# Patient Record
Sex: Female | Born: 1975 | Race: White | Hispanic: No | Marital: Married | Smoking: Never smoker
Health system: Southern US, Community
[De-identification: ages and names within clinical notes are randomized; demographics above are authoritative.]

## PROBLEM LIST (undated history)

## (undated) HISTORY — PX: TONSILLECTOMY: SUR1361

## (undated) HISTORY — PX: BREAST ENHANCEMENT SURGERY: SHX7

---

## 2015-12-08 ENCOUNTER — Inpatient Hospital Stay (HOSPITAL_COMMUNITY): Payer: No Typology Code available for payment source

## 2015-12-08 ENCOUNTER — Encounter (HOSPITAL_COMMUNITY): Payer: Self-pay | Admitting: *Deleted

## 2015-12-08 ENCOUNTER — Inpatient Hospital Stay (HOSPITAL_COMMUNITY)
Admission: AD | Admit: 2015-12-08 | Discharge: 2015-12-08 | Disposition: A | Discharge status: Home / Self Care | Payer: No Typology Code available for payment source | Source: Ambulatory Visit | Attending: Obstetrics and Gynecology | Admitting: Obstetrics and Gynecology

## 2015-12-08 DIAGNOSIS — O209 Hemorrhage in early pregnancy, unspecified: Secondary | ICD-10-CM

## 2015-12-08 DIAGNOSIS — Z3A15 15 weeks gestation of pregnancy: Secondary | ICD-10-CM | POA: Diagnosis not present

## 2015-12-08 DIAGNOSIS — O034 Incomplete spontaneous abortion without complication: Secondary | ICD-10-CM | POA: Diagnosis not present

## 2015-12-08 LAB — CBC
HEMATOCRIT: 36.2 % (ref 36.0–46.0)
Hemoglobin: 12.6 g/dL (ref 12.0–15.0)
MCH: 30.2 pg (ref 26.0–34.0)
MCHC: 34.8 g/dL (ref 30.0–36.0)
MCV: 86.8 fL (ref 78.0–100.0)
Platelets: 211 10*3/uL (ref 150–400)
RBC: 4.17 MIL/uL (ref 3.87–5.11)
RDW: 12.5 % (ref 11.5–15.5)
WBC: 10.1 10*3/uL (ref 4.0–10.5)

## 2015-12-08 LAB — HCG, QUANTITATIVE, PREGNANCY: hCG, Beta Chain, Quant, S: 11184 m[IU]/mL — ABNORMAL HIGH (ref <5)

## 2015-12-08 MED ORDER — HYDROMORPHONE HCL 1 MG/ML IJ SOLN
1.0000 mg | Freq: Once | INTRAMUSCULAR | Status: AC
  Administered 2015-12-08: 1 mg via INTRAVENOUS
  Filled 2015-12-08: qty 1

## 2015-12-08 MED ORDER — LACTATED RINGERS IV BOLUS (SEPSIS)
1000.0000 mL | Freq: Once | INTRAVENOUS | Status: AC
  Administered 2015-12-08: 1000 mL via INTRAVENOUS

## 2015-12-08 MED ORDER — KETOROLAC TROMETHAMINE 30 MG/ML IJ SOLN
30.0000 mg | Freq: Once | INTRAMUSCULAR | Status: AC
  Administered 2015-12-08: 30 mg via INTRAVENOUS
  Filled 2015-12-08: qty 1

## 2015-12-08 MED ORDER — MISOPROSTOL 200 MCG PO TABS
800.0000 ug | ORAL_TABLET | Freq: Once | ORAL | Status: AC
  Administered 2015-12-08: 800 ug via VAGINAL
  Filled 2015-12-08: qty 4

## 2015-12-08 NOTE — MAU Note (Signed)
Pt presents to MAU with complaints of heavy vaginal bleeding. States she started spotting over the weekend and had an appointment with [facility] for an U/S on Monday and when she had the U/S it showed a miscarriage. She states that the bleeding was heavy yesterday but slowed down but when she woke up she had soaked the bed and she has changed 5 or 6 pads this morning with heavy bleeding.

## 2015-12-08 NOTE — Discharge Instructions (Signed)
Incomplete Miscarriage A miscarriage is the sudden loss of an unborn baby (fetus) before the 20th week of pregnancy. In an incomplete miscarriage, parts of the fetus or placenta (afterbirth) remain in the body.  Having a miscarriage can be an emotional experience. Talk with your health care provider about any questions you may have about miscarrying, the grieving process, and your future pregnancy plans. CAUSES   Problems with the fetal chromosomes that make it impossible for the baby to develop normally. Problems with the baby's genes or chromosomes are most often the result of errors that occur by chance as the embryo divides and grows. The problems are not inherited from the parents.  Infection of the cervix or uterus.  Hormone problems.  Problems with the cervix, such as having an incompetent cervix. This is when the tissue in the cervix is not strong enough to hold the pregnancy.  Problems with the uterus, such as an abnormally shaped uterus, uterine fibroids, or congenital abnormalities.  Certain medical conditions.  Smoking, drinking alcohol, or taking illegal drugs.  Trauma. SYMPTOMS   Vaginal bleeding or spotting, with or without cramps or pain.  Pain or cramping in the abdomen or lower back.  Passing fluid, tissue, or blood clots from the vagina. DIAGNOSIS  Your health care provider will perform a physical exam. You may also have an ultrasound to confirm the miscarriage. Blood or urine tests may also be ordered. TREATMENT   Usually, a dilation and curettage (D&C) procedure is performed. During a D&C procedure, the cervix is widened (dilated) and any remaining fetal or placental tissue is gently removed from the uterus.  Antibiotic medicines are prescribed if there is an infection. Other medicines may be given to reduce the size of the uterus (contract) if there is a lot of bleeding.  If you have Rh negative blood and your baby was Rh positive, you will need a Rho (D)  immune globulin shot. This shot will protect any future baby from having Rh blood problems in future pregnancies.  You may be confined to bed rest. This means you should stay in bed and only get up to use the bathroom. HOME CARE INSTRUCTIONS   Rest as directed by your health care provider.  Restrict activity as directed by your health care provider. You may be allowed to continue light activity if curettage was not done but you require further treatment.  Keep track of the number of pads you use each day. Keep track of how soaked (saturated) they are. Record this information.  Do not  use tampons.  Do not douche or have sexual intercourse until approved by your health care provider.  Keep all follow-up appointments for reevaluation and continuing management.  Only take over-the-counter or prescription medicines for pain, fever, or discomfort as directed by your health care provider.  Take antibiotic medicine as directed by your health care provider. Make sure you finish it even if you start to feel better. SEEK IMMEDIATE MEDICAL CARE IF:   You experience severe cramps in your stomach, back, or abdomen.  You have an unexplained temperature (make sure to record these temperatures).  You pass large clots or tissue (save these for your health care provider to inspect).  Your bleeding increases.  You become light-headed, weak, or have fainting episodes. MAKE SURE YOU:   Understand these instructions.  Will watch your condition.  Will get help right away if you are not doing well or get worse.   This information is not intended to   replace advice given to you by your health care provider. Make sure you discuss any questions you have with your health care provider.   Document Released: 12/03/2005 Document Revised: 12/24/2014 Document Reviewed: 07/02/2013 Elsevier Interactive Patient Education 2016 [employer]  

## 2015-12-08 NOTE — MAU Provider Note (Signed)
History     CSN: [id number]  Arrival date and time: 12/08/15 1112   First Provider Initiated Contact with Patient 12/08/15 1133      Chief Complaint  Patient presents with  . Vaginal Bleeding   HPI   [name] is a 39 y.o. female G3P1011 at [id number] presenting to the MAU with vaginal bleeding. The bleeding started on Saturday while the patient was out of town. It started out as very light with some mucus. She spoke to Dr. Chestine Sporelark on "Sunday and was advised to be seen if bleeding increased. Sunday night her bleeding increased. She was seen in Dr. Ross's office on Monday was told the gestational sac and yolk sac only measured 6 weeks; per LMP patient should have been 14-15 weeks.   She was informed that this was a miscarriage in progress and told that likely due to the amount of bleeding that she likely passed everything. The patient was offered cytotec in which she declined at the time.   Patient says this is the worse pain she has ever been in. She currently rates her abdominal cramping 8/10  OB History    Gravida Para Term Preterm AB TAB SAB Ectopic Multiple Living   3 1 1  1  1   1      History reviewed. No pertinent past medical history.  Past Surgical History  Procedure Laterality Date  . Tonsillectomy    . Breast enhancement surgery      History reviewed. No pertinent family history.  Social History  Substance Use Topics  . Smoking status: Never Smoker   . Smokeless tobacco: Never Used  . Alcohol Use: No    Allergies: No Known Allergies  No prescriptions prior to admission   Results for orders placed or performed during the hospital encounter of 12/08/15 (from the past 48 hour(s))  CBC     Status: None   Collection Time: 12/08/15 11:45 AM  Result Value Ref Range   WBC 10.1 4.0 - 10.5 K/uL   RBC 4.17 3.87 - 5.11 MIL/uL   Hemoglobin 12.6 12.0 - 15.0 g/dL   HCT 36.2 36.0 - 46.0 %   MCV 86.8 78.0 - 100.0 fL   MCH 30.2 26.0 - 34.0 pg   MCHC 34.8 30.0 -  36.0 g/dL   RDW 12.5 11.5 - 15.5 %   Platelets 211 150 - 400 K/uL  hCG, quantitative, pregnancy     Status: Abnormal   Collection Time: 12/08/15 11:46 AM  Result Value Ref Range   hCG, Beta Chain, Quant, S 11184 (H) <5 mIU/mL    Comment:          GEST. AGE      CONC.  (mIU/mL)   <=1 WEEK        5 - 50     2 WEEKS       50 - 500     3 WEEKS       100 - 10,000     4 WEEKS     1,000 - 30,000     5 WEEKS     3,500 - 115,000   6-8 WEEKS     12,000 - 270,000    12 WEEKS     15" ,000 - 220,000        FEMALE AND NON-PREGNANT FEMALE:     LESS THAN 5 mIU/mL [facility]   ABO/Rh     Status: None   Collection Time: 12/08/15 11:47 AM  Result Value Ref Range   ABO/RH(D) A POS    US Ob Comp Less 14 Wks  12/08/2015  CLINICAL DATA:  Vaginal bleeding during first trimester pregnancy. Threatened abortion. EXAM: OBSTETRIC <14 WK [place] AND TRANSVAGINAL OB US TECHNIQUE: Both transabdominal and transvaginal ultrasound examinations were performed for complete evaluation of the gestation as well as the maternal uterus, adnexal regions, and pelvic cul-de-sac. Transvaginal technique was performed to assess early pregnancy. COMPARISON:  None. FINDINGS: Intrauterine gestational sac: A single ovoid sac-like structure is seen in the lower portion of the endometrial cavity. Yolk sac:  None visualized Embryo:  None visualized MSD: 7  mm   5 w   3  d Subchorionic hemorrhage:  None visualized. Maternal uterus/adnexae: Heterogeneous material in endocervical canal likely represents blood clot. Both ovaries are normal in appearance. No suspicious adnexal mass or abnormal free fluid identified. IMPRESSION: Probable 5 week intrauterine gestational sac seen in inferior portion of endometrial cavity, but no yolk sac, fetal pole, or cardiac activity yet visualized. Location in lower uterine segment and probable blood clot within endocervical canal are poor prognostic signs. Recommend followup of quantitative HCG  levels, and continued followup by ultrasound in 14 days if clinically warranted. This recommendation follows SRU consensus guidelines: Diagnostic Criteria for Nonviable Pregnancy Early in the First Trimester. Malva Limes Med 2013; 161:0960-45. Electronically Signed   By: Myles Rosenthal M.D.   On: 12/08/2015 13:21   US Ob Transvaginal  12/08/2015  CLINICAL DATA:  Vaginal bleeding during first trimester pregnancy. Threatened abortion. EXAM: OBSTETRIC <14 WK [place] AND TRANSVAGINAL OB US TECHNIQUE: Both transabdominal and transvaginal ultrasound examinations were performed for complete evaluation of the gestation as well as the maternal uterus, adnexal regions, and pelvic cul-de-sac. Transvaginal technique was performed to assess early pregnancy. COMPARISON:  None. FINDINGS: Intrauterine gestational sac: A single ovoid sac-like structure is seen in the lower portion of the endometrial cavity. Yolk sac:  None visualized Embryo:  None visualized MSD: 7  mm   5 w   3  d Subchorionic hemorrhage:  None visualized. Maternal uterus/adnexae: Heterogeneous material in endocervical canal likely represents blood clot. Both ovaries are normal in appearance. No suspicious adnexal mass or abnormal free fluid identified. IMPRESSION: Probable 5 week intrauterine gestational sac seen in inferior portion of endometrial cavity, but no yolk sac, fetal pole, or cardiac activity yet visualized. Location in lower uterine segment and probable blood clot within endocervical canal are poor prognostic signs. Recommend followup of quantitative HCG levels, and continued followup by ultrasound in 14 days if clinically warranted. This recommendation follows SRU consensus guidelines: Diagnostic Criteria for Nonviable Pregnancy Early in the First Trimester. Malva Limes Med 2013; 409:8119-14. Electronically Signed   By: Myles Rosenthal M.D.   On: 12/08/2015 13:21    Review of Systems  Constitutional: Negative for fever and chills.  Gastrointestinal: Positive  for nausea and abdominal pain. Negative for vomiting.  Neurological: Positive for dizziness.   Physical Exam   Blood pressure 103/61, pulse 64, temperature 97.9 F (36.6 C), resp. rate 18, last menstrual period 08/21/2015, unknown if currently breastfeeding.  Physical Exam  Constitutional: She is oriented to person, place, and time. She appears well-developed and well-nourished. No distress.  HENT:  Head: Normocephalic.  Eyes: Pupils are equal, round, and reactive to light.  Neck: Neck supple.  Respiratory: Effort normal.  GI: Soft. There is generalized tenderness.  Genitourinary:  Speculum exam: Vagina - Small amount of dark red blood noted in the vaginal  canal. Fox swabs not needed.  Cervix - cervix appears open 2-3 cm with clot noted protruding through cervical os. Unable to remove clot with ring forceps.  Bimanual exam: Cervix open Uterus non tender, enlarged  Adnexa non tender, no masses bilaterally Chaperone present for exam.  Neurological: She is alert and oriented to person, place, and time.  Skin: Skin is warm. She is not diaphoretic. There is pallor.  Psychiatric: Her behavior is normal.    MAU Course  Procedures  None  MDM  CBC Quant ABO A positive blood type   LR bolus Dilaudid  1 mg IV Toradol 30 mg IV  Dilaudid repeated 1 mg IV Toradol repeated 30 mg IV   [place].  Discussed patient with Dr. Claiborne Billings. Cytotec recommended Discussed US findings with the patient.  Discussed the use of cytotec due to incomplete SAB and gestational sac in the lower uterine segment of the uterus. Patient and significant other agreeable to plan of care. Consent form signed Cytotec 800 mcg administered per vagina by NP. Patient tolerated well.   Assessment and Plan   A:  1. Incomplete miscarriage   2. Vaginal bleeding in pregnancy, first trimester    P:  Discharge home in stable condition Bleeding precautions Patient has RX at home for percocet.  Ibuprofen as  directed on the bottle Return to MAU if symptoms worsen Call the office on Tuesday to schedule follow up visit in the office Support given.    Duane Lope, NP 12/08/2015 9:38 PM

## 2015-12-09 LAB — ABO/RH: ABO/RH(D): A POS

## 2017-01-10 ENCOUNTER — Other Ambulatory Visit: Payer: Self-pay | Admitting: Obstetrics and Gynecology

## 2017-01-10 DIAGNOSIS — N63 Unspecified lump in unspecified breast: Secondary | ICD-10-CM

## 2017-01-15 ENCOUNTER — Other Ambulatory Visit: Payer: Self-pay | Admitting: Obstetrics and Gynecology

## 2017-01-15 ENCOUNTER — Ambulatory Visit
Admission: RE | Admit: 2017-01-15 | Discharge: 2017-01-15 | Disposition: A | Discharge status: Home / Self Care | Payer: PRIVATE HEALTH INSURANCE | Source: Ambulatory Visit | Attending: Obstetrics and Gynecology | Admitting: Obstetrics and Gynecology

## 2017-01-15 DIAGNOSIS — N63 Unspecified lump in unspecified breast: Secondary | ICD-10-CM

## 2017-07-27 IMAGING — US US OB COMP LESS 14 WK
1 series · 15 of 28 positions shown · non-contrast
Comparison: None.

CLINICAL DATA: Vaginal bleeding during first trimester pregnancy.
Threatened abortion.

EXAM:
OBSTETRIC <14 WK US AND TRANSVAGINAL OB US
TECHNIQUE: Both transabdominal and transvaginal ultrasound examinations were
performed for complete evaluation of the gestation as well as the
maternal uterus, adnexal regions, and pelvic cul-de-sac.
Transvaginal technique was performed to assess early pregnancy.

[Series 1: us ob comp less 14 wk · 15 of 61 slices shown]
[im 1/61]
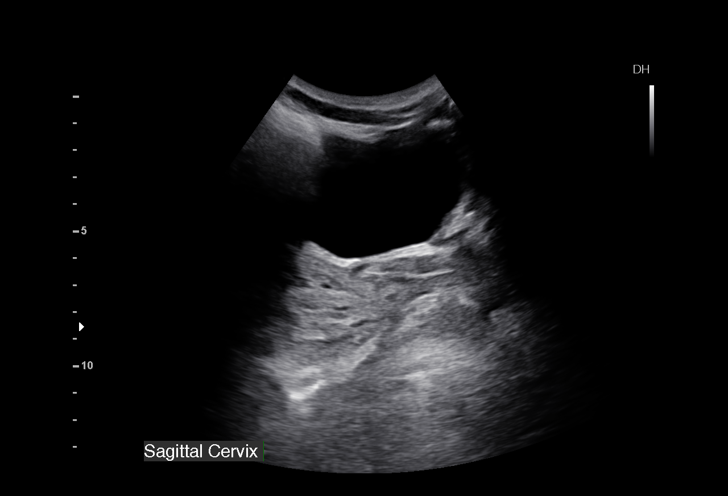
[im 5/61]
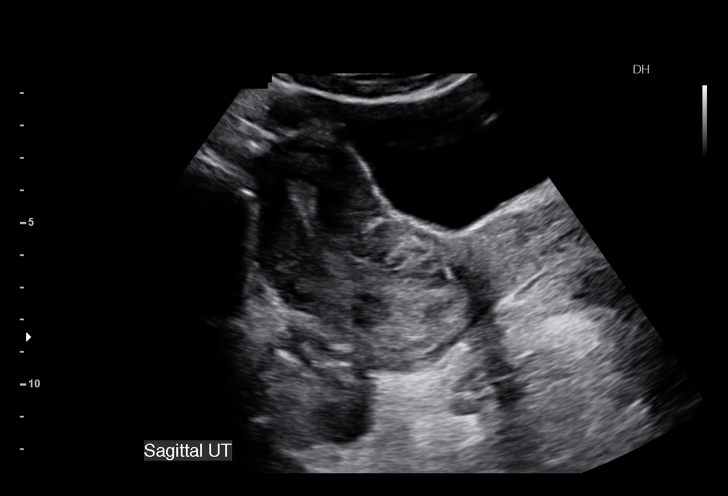
[im 9/61]
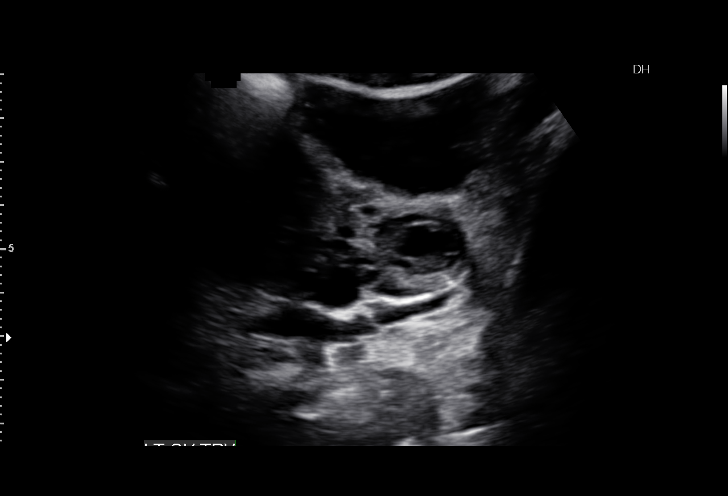
[im 14/61]
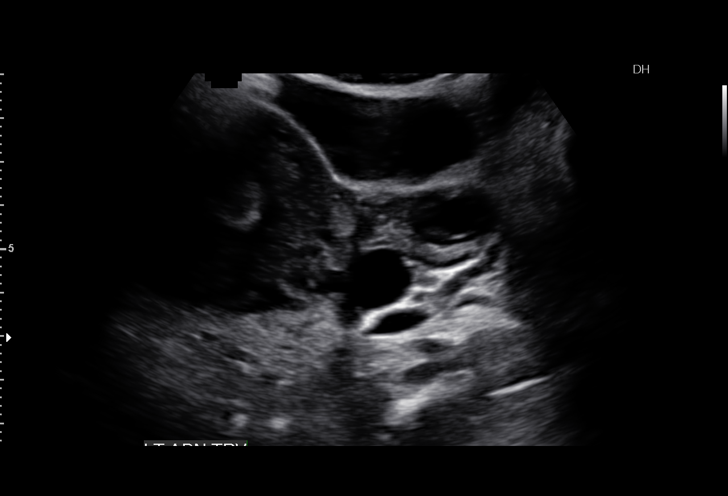
[im 18/61]
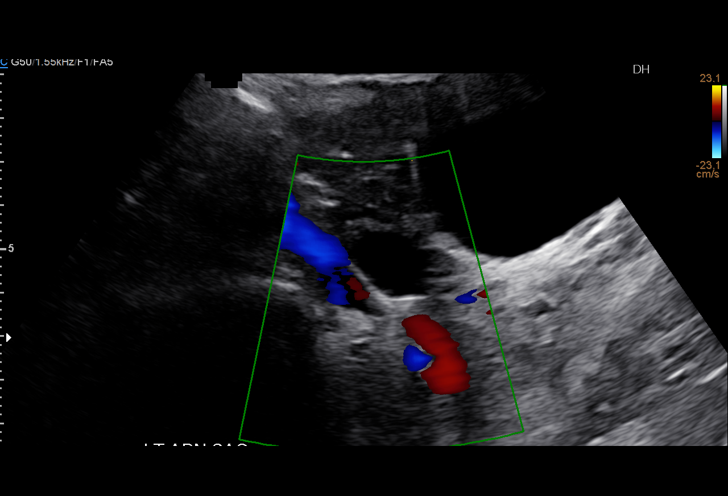
[im 23/61]
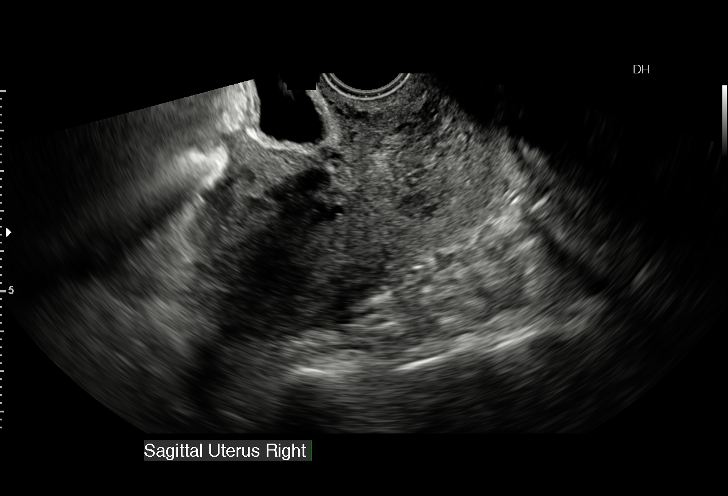
[im 27/61]
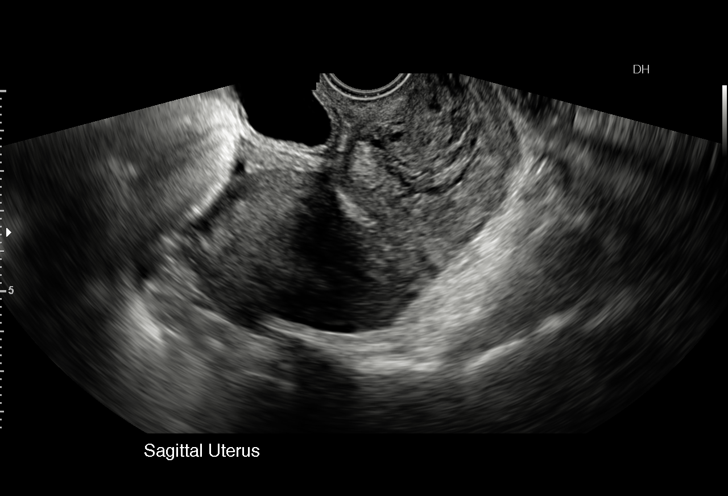
[im 32/61]
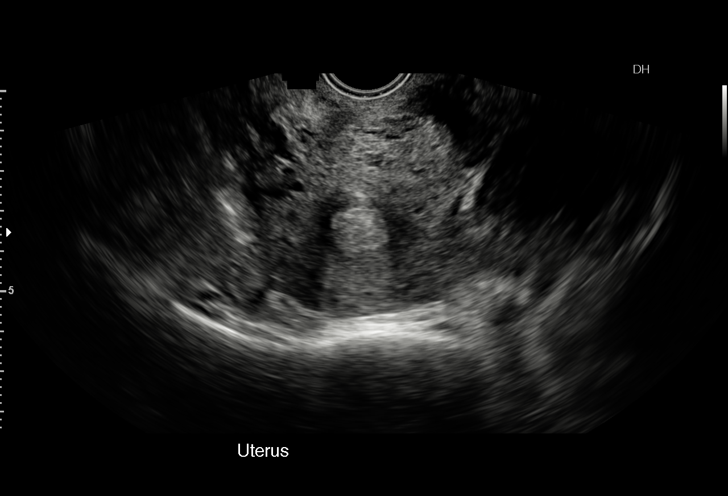
[im 34/61]
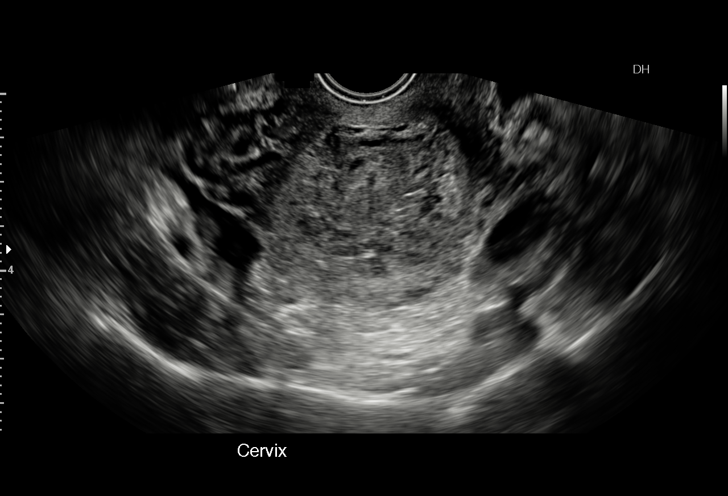
[im 38/61]
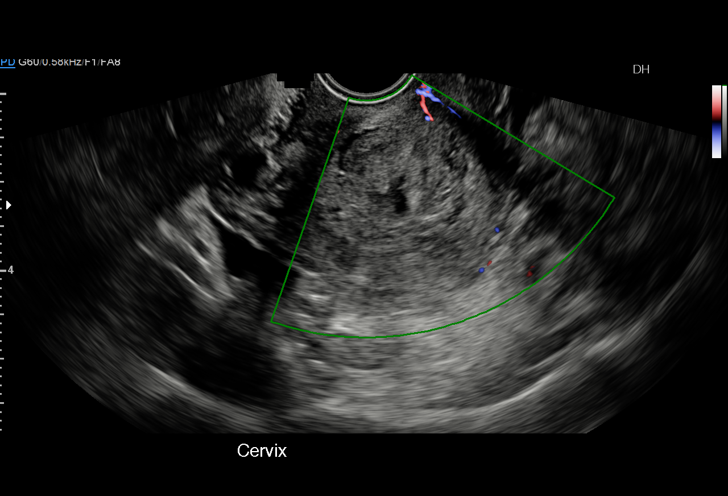
[im 43/61]
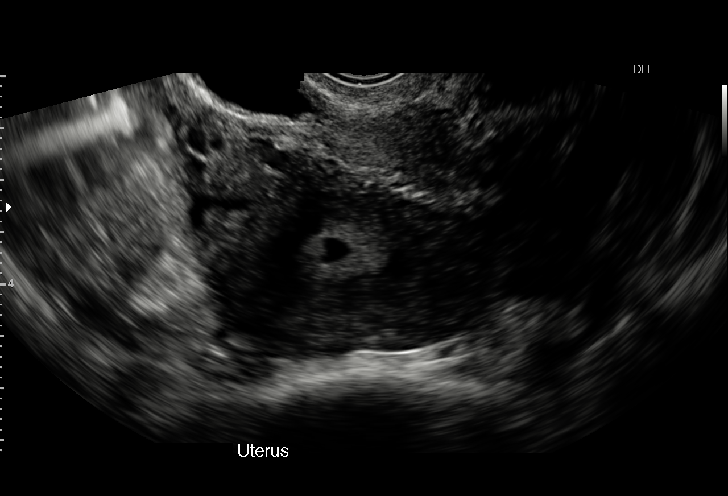
[im 47/61]
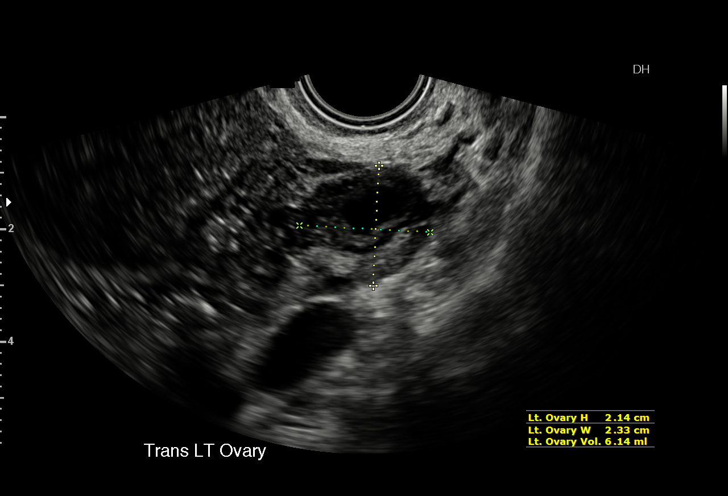
[im 52/61]
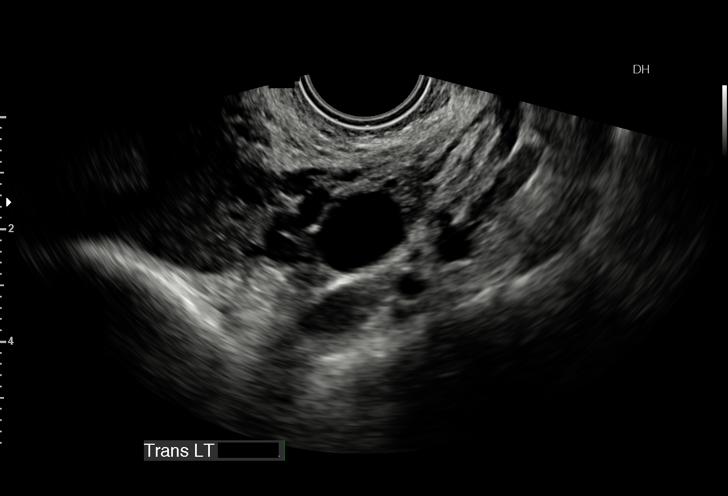
[im 56/61]
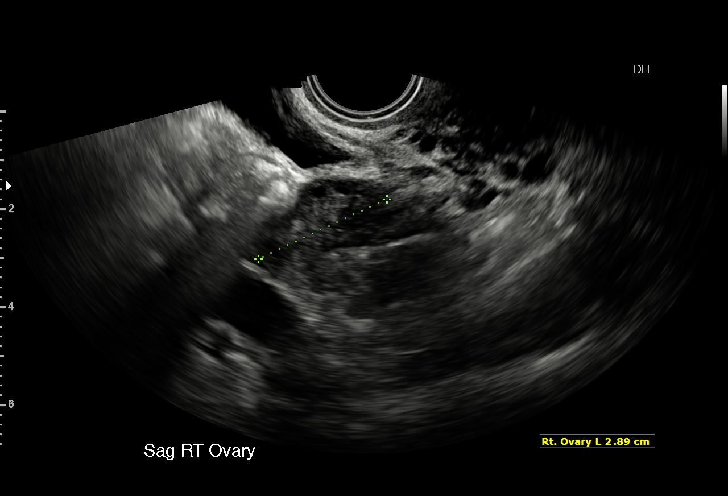
[im 61/61]
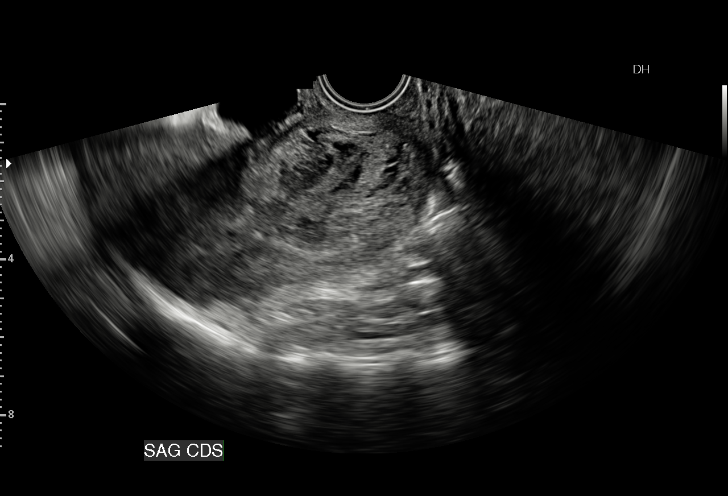

[15 of 28 positions shown; findings below may reference images not displayed]

FINDINGS: Intrauterine gestational sac: A single ovoid sac-like structure is
seen in the lower portion of the endometrial cavity.

Yolk sac:  None visualized

Embryo:  None visualized

MSD: 7  mm   5 w   3  d

Subchorionic hemorrhage:  None visualized.

Maternal uterus/adnexae: Heterogeneous material in endocervical
canal likely represents blood clot. Both ovaries are normal in
appearance. No suspicious adnexal mass or abnormal free fluid
identified.
IMPRESSION: Probable 5 week intrauterine gestational sac seen in inferior
portion of endometrial cavity, but no yolk sac, fetal pole, or
cardiac activity yet visualized. Location in lower uterine segment
and probable blood clot within endocervical canal are poor
prognostic signs. Recommend followup of quantitative HCG levels, and
continued followup by ultrasound in 14 days if clinically warranted.
This recommendation follows SRU consensus guidelines: Diagnostic
Criteria for Nonviable Pregnancy Early in the First Trimester. N
Engl J Med 4118; [DATE].
